# Patient Record
Sex: Male | Born: 2000 | Race: White | Hispanic: No | Marital: Single | Smoking: Never smoker
Health system: Southern US, Community
[De-identification: ages and names within clinical notes are randomized; demographics above are authoritative.]

---
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
## 2019-11-25 DIAGNOSIS — T23001A Burn of unspecified degree of right hand, unspecified site, initial encounter: Secondary | ICD-10-CM | POA: Diagnosis not present

## 2021-01-11 DIAGNOSIS — H52203 Unspecified astigmatism, bilateral: Secondary | ICD-10-CM | POA: Diagnosis not present

## 2021-01-11 DIAGNOSIS — H5213 Myopia, bilateral: Secondary | ICD-10-CM | POA: Diagnosis not present

## 2021-01-11 DIAGNOSIS — H53003 Unspecified amblyopia, bilateral: Secondary | ICD-10-CM | POA: Diagnosis not present

## 2021-02-20 DIAGNOSIS — M9902 Segmental and somatic dysfunction of thoracic region: Secondary | ICD-10-CM | POA: Diagnosis not present

## 2021-02-20 DIAGNOSIS — M9905 Segmental and somatic dysfunction of pelvic region: Secondary | ICD-10-CM | POA: Diagnosis not present

## 2021-02-20 DIAGNOSIS — M62830 Muscle spasm of back: Secondary | ICD-10-CM | POA: Diagnosis not present

## 2021-02-20 DIAGNOSIS — M9903 Segmental and somatic dysfunction of lumbar region: Secondary | ICD-10-CM | POA: Diagnosis not present

## 2021-03-21 ENCOUNTER — Ambulatory Visit
Admission: RE | Admit: 2021-03-21 | Discharge: 2021-03-21 | Disposition: A | Discharge status: Home / Self Care | Payer: BC Managed Care – PPO | Source: Ambulatory Visit | Attending: Family Medicine | Admitting: Family Medicine

## 2021-03-21 ENCOUNTER — Other Ambulatory Visit: Payer: Self-pay

## 2021-03-21 VITALS — BP 133/83 | HR 88 | Temp 98.9°F | Resp 14

## 2021-03-21 DIAGNOSIS — J069 Acute upper respiratory infection, unspecified: Secondary | ICD-10-CM | POA: Diagnosis not present

## 2021-03-21 DIAGNOSIS — Z20828 Contact with and (suspected) exposure to other viral communicable diseases: Secondary | ICD-10-CM

## 2021-03-21 MED ORDER — FLUTICASONE PROPIONATE 50 MCG/ACT NA SUSP: 1.0000 | Freq: Two times a day (BID) | NASAL | 0 refills | Status: DC

## 2021-03-21 MED ORDER — PROMETHAZINE-DM 6.25-15 MG/5ML PO SYRP: 5.0000 mL | ORAL_SOLUTION | Freq: Four times a day (QID) | ORAL | 0 refills | Status: DC | PRN

## 2021-03-21 NOTE — ED Provider Notes (Signed)
[facility]    CSN: [id number] Arrival date & time: 03/21/21  0846      History   Chief Complaint Chief Complaint  Patient presents with   Nasal Congestion   Fever    HPI Austin Berg is a 20 y.o. male.   Patient presenting today with 4-day history of nasal congestion, cough, fever, fatigue.  Denies chest pain, shortness of breath, abdominal pain, nausea vomiting or diarrhea.  So far taking over-the-counter cold and congestion medications, fever reducers with mild temporary relief of symptoms.  No known sick contacts recently.  No known pertinent medical problems.   History reviewed. No pertinent past medical history.  Patient Active Problem List   Diagnosis Date Noted   Patellar dislocation 02/09/2014   Acute medial meniscus tear of left knee 02/01/2014   Tear of MCL (medial collateral ligament) of knee 02/01/2014    History reviewed. No pertinent surgical history.     Home Medications    Prior to Admission medications   Medication Sig Start Date End Date Taking? Authorizing Provider  fluticasone (FLONASE) 50 MCG/ACT nasal spray Place 1 spray into both nostrils 2 (two) times daily. 03/21/21  Yes Particia Nearing, PA-C  promethazine-dextromethorphan (PROMETHAZINE-DM) 6.25-15 MG/5ML syrup Take 5 mLs by mouth 4 (four) times daily as needed. 03/21/21  Yes Particia Nearing, PA-C  IBUPROFEN IB PO Take by mouth.    [provider]    Family History History reviewed. No pertinent family history.  Social History Social History   Tobacco Use   Smoking status: Never     Allergies   Penicillins   Review of Systems Review of Systems Per HPI  Physical Exam Triage Vital Signs ED Triage Vitals  Enc Vitals Group     BP 03/21/21 0922 133/83     Pulse Rate 03/21/21 0922 88     Resp 03/21/21 0922 14     Temp 03/21/21 0922 98.9 F (37.2 C)     Temp Source 03/21/21 0922 Oral     SpO2 03/21/21 0922 97 %     Weight --       Height --      Head Circumference --      Peak Flow --      Pain Score 03/21/21 0930 0     Pain Loc --      Pain Edu? --      Excl. in GC? --    No data found.  Updated Vital Signs BP 133/83 (BP Location: Right Arm)    Pulse 88    Temp 98.9 F (37.2 C) (Oral)    Resp 14    SpO2 97%   Visual Acuity Right Eye Distance:   Left Eye Distance:   Bilateral Distance:    Right Eye Near:   Left Eye Near:    Bilateral Near:     Physical Exam Vitals and nursing note reviewed.  Constitutional:      Appearance: Normal appearance.  HENT:     Head: Atraumatic.     Right Ear: Tympanic membrane normal.     Left Ear: Tympanic membrane normal.     Nose: Rhinorrhea present.     Mouth/Throat:     Pharynx: Posterior oropharyngeal erythema present.  Eyes:     Extraocular Movements: Extraocular movements intact.     Conjunctiva/sclera: Conjunctivae normal.  Cardiovascular:     Rate and Rhythm: Normal rate and regular rhythm.  Pulmonary:     Effort: Pulmonary effort  is normal. No respiratory distress.     Breath sounds: Normal breath sounds. No wheezing or rales.  Musculoskeletal:        General: Normal range of motion.     Cervical back: Normal range of motion and neck supple.  Skin:    General: Skin is warm and dry.  Neurological:     General: No focal deficit present.     Mental Status: He is oriented to person, place, and time.  Psychiatric:        Mood and Affect: Mood normal.        Thought Content: Thought content normal.        Judgment: Judgment normal.     UC Treatments / Results  Labs (all labs ordered are listed, but only abnormal results are displayed) Labs Reviewed  COVID-19, FLU A+B NAA    EKG   Radiology No results found.  Procedures Procedures (including critical care time)  Medications Ordered in UC Medications - No data to display  Initial Impression / Assessment and Plan / UC Course  I have reviewed the triage vital signs and the nursing  notes.  Pertinent labs & imaging results that were available during my care of the patient were reviewed by me and considered in my medical decision making (see chart for details).     Vital signs and exam overall reassuring today, suspect viral upper respiratory infection.  COVID and flu testing pending.  Given duration of symptoms we will forego antiviral therapy proactively at this time.  Treat symptomatically with Flonase, Phenergan DM, over-the-counter supportive medications and home care. Return for acutely worsening symptoms.  Final Clinical Impressions(s) / UC Diagnoses   Final diagnoses:  Exposure to the flu  Viral URI with cough   Discharge Instructions   None    ED Prescriptions     Medication Sig Dispense Auth. Provider   promethazine-dextromethorphan (PROMETHAZINE-DM) 6.25-15 MG/5ML syrup Take 5 mLs by mouth 4 (four) times daily as needed. 100 mL Particia Nearing, PA-C   fluticasone [facility]) 50 MCG/ACT nasal spray Place 1 spray into both nostrils 2 (two) times daily. 16 g Particia Nearing, [place]      PDMP not reviewed this encounter.   Particia Nearing, [place] 03/21/21 1108

## 2021-03-21 NOTE — ED Triage Notes (Signed)
Pt presents with nasal congestion , cough and fever since Friday, covid test negative

## 2021-03-22 ENCOUNTER — Telehealth: Payer: BC Managed Care – PPO | Admitting: Physician Assistant

## 2021-03-22 DIAGNOSIS — J019 Acute sinusitis, unspecified: Secondary | ICD-10-CM

## 2021-03-22 DIAGNOSIS — B9689 Other specified bacterial agents as the cause of diseases classified elsewhere: Secondary | ICD-10-CM

## 2021-03-22 LAB — COVID-19, FLU A+B NAA
Influenza A, NAA: NOT DETECTED
Influenza B, NAA: NOT DETECTED
SARS-CoV-2, NAA: NOT DETECTED

## 2021-03-22 MED ORDER — DOXYCYCLINE HYCLATE 100 MG PO TABS: 100.0000 mg | ORAL_TABLET | Freq: Two times a day (BID) | ORAL | 0 refills | Status: DC

## 2021-03-22 NOTE — Progress Notes (Signed)
I have spent 5 minutes in review of e-visit questionnaire, review and updating patient chart, medical decision making and response to patient.   Kashton Mcartor Cody Nelsie Domino, PA-C    

## 2021-03-22 NOTE — Progress Notes (Signed)

## 2021-05-11 ENCOUNTER — Telehealth: Payer: BC Managed Care – PPO | Admitting: Emergency Medicine

## 2021-05-11 DIAGNOSIS — J111 Influenza due to unidentified influenza virus with other respiratory manifestations: Secondary | ICD-10-CM | POA: Diagnosis not present

## 2021-05-11 NOTE — Progress Notes (Signed)
E visit for Flu like symptoms   We are sorry that you are not feeling well.  Here is how we plan to help! Based on what you have shared with me it looks like you may have flu-like symptoms that should be watched but do not seem to indicate anti-viral treatment.  Influenza or the flu is   an infection caused by a respiratory virus. The flu virus is highly contagious and persons who did not receive their yearly flu vaccination may catch the flu from close contact.  Influenza is not very prevalent in our community right now and it's less likely that you have the flu. It's more likely that you have COVID or another virus that causes upper respiratory infections, such as rhinovirus. Based upon your symptoms and potential risk factors I recommend that you follow the flu symptoms recommendation that I have listed below.  ANYONE WHO HAS FLU SYMPTOMS SHOULD: Get tested for COVID Stay home. The flu is highly contagious and going out or to work exposes others! Be sure to drink plenty of fluids. Water is fine as well as fruit juices, sodas and electrolyte beverages. You may want to stay away from caffeine or alcohol. If you are nauseated, try taking small sips of liquids. How do you know if you are getting enough fluid? Your urine should be a pale yellow or almost colorless. Get rest. Taking a steamy shower or using a humidifier may help nasal congestion and ease sore throat pain. Using a saline nasal spray works much the same way. Mucinex also helps deal with congestion\ Cough drops, hard candies and sore throat lozenges may ease your cough. You may use cough suppressants such as Delsym or use Mucinex DM (contains same medicine as Delsym plus Mucinex).  Line up a caregiver. Have someone check on you regularly. Avoid close contacts especially the very young and the elderly Cover your mouth if you cough or sneeze Always remember to wash your hands.    GET HELP RIGHT AWAY IF: You cannot keep down  liquids or your medications. You become short of breath Your fell like you are going to pass out or loose consciousness. Your symptoms persist after you have completed your treatment plan MAKE SURE YOU  Understand these instructions. Will watch your condition. Will get help right away if you are not doing well or get worse.  Your e-visit answers were reviewed by a board certified advanced clinical practitioner to complete your personal care plan.  Depending on the condition, your plan could have included both over the counter or prescription medications.  If there is a problem please reply  once you have received a response from your provider.  Your safety is important to [place].  If you have drug allergies check your prescription carefully.    You can use MyChart to ask questions about todays visit, request a non-urgent call back, or ask for a work or school excuse for 24 hours related to this e-Visit. If it has been greater than 24 hours you will need to follow up with your provider, or enter a new e-Visit to address those concerns.  You will get an e-mail in the next two days asking about your experience.  I hope that your e-visit has been valuable and will speed your recovery. Thank you for using e-visits.  I have spent 5 minutes in review of e-visit questionnaire, review and updating patient chart, medical decision making and response to patient.   Rica Mast, PhD, FNP-BC

## 2021-05-15 ENCOUNTER — Ambulatory Visit
Admission: RE | Admit: 2021-05-15 | Discharge: 2021-05-15 | Disposition: A | Discharge status: Home / Self Care | Payer: BC Managed Care – PPO | Source: Ambulatory Visit | Attending: Family Medicine | Admitting: Family Medicine

## 2021-05-15 ENCOUNTER — Other Ambulatory Visit: Payer: Self-pay

## 2021-05-15 VITALS — BP 147/79 | HR 87 | Temp 98.8°F | Resp 18

## 2021-05-15 DIAGNOSIS — J069 Acute upper respiratory infection, unspecified: Secondary | ICD-10-CM | POA: Diagnosis not present

## 2021-05-15 DIAGNOSIS — J029 Acute pharyngitis, unspecified: Secondary | ICD-10-CM | POA: Diagnosis not present

## 2021-05-15 LAB — POCT RAPID STREP A (OFFICE): Rapid Strep A Screen: NEGATIVE

## 2021-05-15 MED ORDER — PREDNISONE 20 MG PO TABS: 40.0000 mg | ORAL_TABLET | Freq: Every day | ORAL | 0 refills | Status: DC

## 2021-05-15 NOTE — ED Provider Notes (Signed)
[facility]    CSN: [id number] Arrival date & time: 05/15/21  1016      History   Chief Complaint Chief Complaint  Patient presents with   Fever   Sore Throat   Nasal Congestion         HPI Austin Berg is a 21 y.o. male.   Presenting today with 5-day history of sore throat, nasal congestion, fever, fatigue, aches.  Denies chest pain, shortness of breath, abdominal pain, nausea vomiting or diarrhea.  Taking over-the-counter cold and congestion medication with no significant benefit.  Spouse sick with similar symptoms.  No known pertinent chronic medical problems.   History reviewed. No pertinent past medical history.  Patient Active Problem List   Diagnosis Date Noted   Patellar dislocation 02/09/2014   Acute medial meniscus tear of left knee 02/01/2014   Tear of MCL (medial collateral ligament) of knee 02/01/2014    History reviewed. No pertinent surgical history.     Home Medications    Prior to Admission medications   Medication Sig Start Date End Date Taking? Authorizing Provider  predniSONE (DELTASONE) 20 MG tablet Take 2 tablets (40 mg total) by mouth daily with breakfast. 05/15/21  Yes Particia Nearing, PA-C  doxycycline (VIBRA-TABS) 100 MG tablet Take 1 tablet (100 mg total) by mouth 2 (two) times daily. 03/22/21   Waldon Merl, PA-C  fluticasone (FLONASE) 50 MCG/ACT nasal spray Place 1 spray into both nostrils 2 (two) times daily. 03/21/21   Particia Nearing, PA-C  IBUPROFEN IB PO Take by mouth.    [provider]  promethazine-dextromethorphan (PROMETHAZINE-DM) 6.25-15 MG/5ML syrup Take 5 mLs by mouth 4 (four) times daily as needed. 03/21/21   Particia Nearing, PA-C    Family History Family History  Problem Relation Age of Onset   Healthy Mother    Healthy Father     Social History Social History   Tobacco Use   Smoking status: Never   Smokeless tobacco: Never  Substance Use Topics   Alcohol use:  Yes   Drug use: Never     Allergies   Penicillins   Review of Systems Review of Systems Per HPI  Physical Exam Triage Vital Signs ED Triage Vitals  Enc Vitals Group     BP 05/15/21 1113 (!) 147/79     Pulse Rate 05/15/21 1113 87     Resp 05/15/21 1113 18     Temp 05/15/21 1113 98.8 F (37.1 C)     Temp Source 05/15/21 1113 Oral     SpO2 05/15/21 1113 96 %     Weight --      Height --      Head Circumference --      Peak Flow --      Pain Score 05/15/21 1112 4     Pain Loc --      Pain Edu? --      Excl. in GC? --    No data found.  Updated Vital Signs BP (!) 147/79 (BP Location: Right Arm)    Pulse 87    Temp 98.8 F (37.1 C) (Oral)    Resp 18    SpO2 96%   Visual Acuity Right Eye Distance:   Left Eye Distance:   Bilateral Distance:    Right Eye Near:   Left Eye Near:    Bilateral Near:     Physical Exam Vitals and nursing note reviewed.  Constitutional:      Appearance: He  is well-developed.  HENT:     Head: Atraumatic.     Right Ear: External ear normal.     Left Ear: External ear normal.     Nose: Rhinorrhea present.     Mouth/Throat:     Pharynx: Posterior oropharyngeal erythema present. No oropharyngeal exudate.  Eyes:     Conjunctiva/sclera: Conjunctivae normal.     Pupils: Pupils are equal, round, and reactive to light.  Cardiovascular:     Rate and Rhythm: Normal rate and regular rhythm.     Heart sounds: Normal heart sounds.  Pulmonary:     Effort: Pulmonary effort is normal. No respiratory distress.     Breath sounds: No wheezing or rales.  Musculoskeletal:        General: Normal range of motion.     Cervical back: Normal range of motion and neck supple.  Lymphadenopathy:     Cervical: No cervical adenopathy.  Skin:    General: Skin is warm and dry.  Neurological:     Mental Status: He is alert and oriented to person, place, and time.  Psychiatric:        Behavior: Behavior normal.   UC Treatments / Results  Labs (all labs  ordered are listed, but only abnormal results are displayed) Labs Reviewed  CULTURE, GROUP A STREP [facility])  POCT RAPID STREP A (OFFICE)    EKG   Radiology No results found.  Procedures Procedures (including critical care time)  Medications Ordered in UC Medications - No data to display  Initial Impression / Assessment and Plan / UC Course  I have reviewed the triage vital signs and the nursing notes.  Pertinent labs & imaging results that were available during my care of the patient were reviewed by me and considered in my medical decision making (see chart for details).     Rapid strep negative, throat culture pending.  Declines viral testing today.  We will treat with prednisone, over-the-counter cold and congestion medications, supportive home care.  Return for acutely worsening symptoms.  Final Clinical Impressions(s) / UC Diagnoses   Final diagnoses:  Sore throat   Discharge Instructions   None    ED Prescriptions     Medication Sig Dispense Auth. Provider   predniSONE (DELTASONE) 20 MG tablet Take 2 tablets (40 mg total) by mouth daily with breakfast. 10 tablet Particia Nearing, [place]      PDMP not reviewed this encounter.   Particia Nearing, [place] 05/15/21 1729

## 2021-05-15 NOTE — ED Triage Notes (Signed)
Pt reports sore throat and nasal congestion x 5 days; fever 101.1 F x 3 days.

## 2021-05-18 LAB — CULTURE, GROUP A STREP (THRC)

## 2022-01-12 DIAGNOSIS — H53003 Unspecified amblyopia, bilateral: Secondary | ICD-10-CM | POA: Diagnosis not present

## 2022-01-12 DIAGNOSIS — H5213 Myopia, bilateral: Secondary | ICD-10-CM | POA: Diagnosis not present

## 2022-03-12 DIAGNOSIS — H53003 Unspecified amblyopia, bilateral: Secondary | ICD-10-CM | POA: Diagnosis not present

## 2022-03-30 DIAGNOSIS — Z6835 Body mass index (BMI) 35.0-35.9, adult: Secondary | ICD-10-CM | POA: Diagnosis not present

## 2022-03-30 DIAGNOSIS — E669 Obesity, unspecified: Secondary | ICD-10-CM | POA: Diagnosis not present

## 2022-03-30 DIAGNOSIS — J069 Acute upper respiratory infection, unspecified: Secondary | ICD-10-CM | POA: Diagnosis not present

## 2022-03-31 ENCOUNTER — Telehealth: Payer: BC Managed Care – PPO | Admitting: Physician Assistant

## 2022-03-31 DIAGNOSIS — J020 Streptococcal pharyngitis: Secondary | ICD-10-CM | POA: Diagnosis not present

## 2022-03-31 MED ORDER — AZITHROMYCIN 250 MG PO TABS: ORAL_TABLET | ORAL | 0 refills | Status: AC

## 2022-03-31 NOTE — Progress Notes (Signed)

## 2022-04-10 ENCOUNTER — Ambulatory Visit
Admission: EM | Admit: 2022-04-10 | Discharge: 2022-04-10 | Disposition: A | Discharge status: Home / Self Care | Payer: BC Managed Care – PPO | Attending: Urgent Care | Admitting: Urgent Care

## 2022-04-10 ENCOUNTER — Encounter: Payer: Self-pay | Admitting: Emergency Medicine

## 2022-04-10 DIAGNOSIS — R070 Pain in throat: Secondary | ICD-10-CM | POA: Diagnosis not present

## 2022-04-10 DIAGNOSIS — Z1152 Encounter for screening for COVID-19: Secondary | ICD-10-CM | POA: Diagnosis not present

## 2022-04-10 DIAGNOSIS — R61 Generalized hyperhidrosis: Secondary | ICD-10-CM | POA: Diagnosis not present

## 2022-04-10 DIAGNOSIS — J018 Other acute sinusitis: Secondary | ICD-10-CM | POA: Diagnosis not present

## 2022-04-10 MED ORDER — PROMETHAZINE-DM 6.25-15 MG/5ML PO SYRP: 5.0000 mL | ORAL_SOLUTION | Freq: Three times a day (TID) | ORAL | 0 refills | Status: AC | PRN

## 2022-04-10 MED ORDER — AMOXICILLIN-POT CLAVULANATE 875-125 MG PO TABS: 1.0000 | ORAL_TABLET | Freq: Two times a day (BID) | ORAL | 0 refills | Status: DC

## 2022-04-10 MED ORDER — AMOXICILLIN-POT CLAVULANATE 875-125 MG PO TABS: 1.0000 | ORAL_TABLET | Freq: Two times a day (BID) | ORAL | 0 refills | Status: AC

## 2022-04-10 MED ORDER — CETIRIZINE HCL 10 MG PO TABS: 10.0000 mg | ORAL_TABLET | Freq: Every day | ORAL | 0 refills | Status: AC

## 2022-04-10 NOTE — ED Triage Notes (Signed)
Scratchy throat, nasal congestions, fever, headache x 3 to 4 days.

## 2022-04-10 NOTE — ED Provider Notes (Signed)
[facility]  Note:  This document was prepared using Systems analyst and may include unintentional dictation errors.  MRN: [id number] DOB: 2000-04-21  Subjective:   Austin Berg is a 22 y.o. male presenting for 10 to 14-day history of acute onset persistent sinus congestion, malaise, fatigue, throat pain, coughing.  Patient had a very short-term recovery in the middle of his illness before he felt much worse in the past 4 to 5 days.  No history of asthma.  No allergies.  No chest pain, shortness of breath or wheezing.  No smoking.  No current facility-administered medications for this encounter.  Current Outpatient Medications:    IBUPROFEN IB PO, Take by mouth., Disp: , Rfl:    Allergies  Allergen Reactions   Penicillins Rash    History reviewed. No pertinent past medical history.   History reviewed. No pertinent surgical history.  Family History  Problem Relation Age of Onset   Healthy Mother    Healthy Father     Social History   Tobacco Use   Smoking status: Never   Smokeless tobacco: Never  Vaping Use   Vaping Use: Never used  Substance Use Topics   Alcohol use: Yes   Drug use: Never    ROS   Objective:   Vitals: BP (!) 144/85 (BP Location: Right Arm)   Pulse 99   Temp 99.1 F (37.3 C) (Oral)   Resp 18   SpO2 95%   Physical Exam Constitutional:      General: He is not in acute distress.    Appearance: Normal appearance. He is well-developed and normal weight. He is diaphoretic. He is not ill-appearing or toxic-appearing.  HENT:     Head: Normocephalic and atraumatic.     Right Ear: Tympanic membrane, ear canal and external ear normal. No drainage, swelling or tenderness. No middle ear effusion. There is no impacted cerumen. Tympanic membrane is not erythematous or bulging.     Left Ear: Tympanic membrane, ear canal and external ear normal. No drainage, swelling or tenderness.  No middle ear effusion.  There is no impacted cerumen. Tympanic membrane is not erythematous or bulging.     Nose: Congestion present. No rhinorrhea.     Mouth/Throat:     Mouth: Mucous membranes are moist.     Pharynx: No oropharyngeal exudate or posterior oropharyngeal erythema.  Eyes:     General: No scleral icterus.       Right eye: No discharge.        Left eye: No discharge.     Extraocular Movements: Extraocular movements intact.     Conjunctiva/sclera: Conjunctivae normal.  Cardiovascular:     Rate and Rhythm: Normal rate and regular rhythm.     Heart sounds: Normal heart sounds. No murmur heard.    No friction rub. No gallop.  Pulmonary:     Effort: Pulmonary effort is normal. No respiratory distress.     Breath sounds: Normal breath sounds. No stridor. No wheezing, rhonchi or rales.  Musculoskeletal:     Cervical back: Normal range of motion and neck supple. No rigidity. No muscular tenderness.  Neurological:     General: No focal deficit present.     Mental Status: He is alert and oriented to person, place, and time.  Psychiatric:        Mood and Affect: Mood normal.        Behavior: Behavior normal.        Thought Content:  Thought content normal.     Assessment and Plan :   PDMP not reviewed this encounter.  1. Acute non-recurrent sinusitis of other sinus   2. Throat pain   3. Diaphoresis     High suspicion for COVID 19.  Will pursue testing for diagnostic purposes.  Patient confirmed with his parents that he is not allergic to amoxicillin. Deferred imaging given clear cardiopulmonary exam, hemodynamically stable vital signs.  Will focus in on sinusitis with Augmentin.  Use supportive care otherwise.  Counseled patient on potential for adverse effects with medications prescribed/recommended today, ER and return-to-clinic precautions discussed, patient verbalized understanding.    Jaynee Eagles, [place] 04/10/22 [phone]

## 2022-04-11 LAB — SARS CORONAVIRUS 2 (TAT 6-24 HRS): SARS Coronavirus 2: NEGATIVE

## 2023-01-15 DIAGNOSIS — H5213 Myopia, bilateral: Secondary | ICD-10-CM | POA: Diagnosis not present

## 2023-01-15 DIAGNOSIS — H5500 Unspecified nystagmus: Secondary | ICD-10-CM | POA: Diagnosis not present

## 2023-01-15 DIAGNOSIS — H53002 Unspecified amblyopia, left eye: Secondary | ICD-10-CM | POA: Diagnosis not present
